# Patient Record
Sex: Male | Born: 1960 | Race: White | Hispanic: No | Marital: Married | Smoking: Former smoker
Health system: Southern US, Community
[De-identification: ages and names within clinical notes are randomized; demographics above are authoritative.]

## PROBLEM LIST (undated history)

## (undated) DIAGNOSIS — I10 Essential (primary) hypertension: Secondary | ICD-10-CM

## (undated) DIAGNOSIS — M1990 Unspecified osteoarthritis, unspecified site: Secondary | ICD-10-CM

## (undated) DIAGNOSIS — K219 Gastro-esophageal reflux disease without esophagitis: Secondary | ICD-10-CM

## (undated) HISTORY — DX: Essential (primary) hypertension: I10

## (undated) HISTORY — PX: OTHER SURGICAL HISTORY: SHX169

## (undated) HISTORY — PX: EXCISIONAL HEMORRHOIDECTOMY: SHX1541

## (undated) HISTORY — DX: Gastro-esophageal reflux disease without esophagitis: K21.9

## (undated) HISTORY — DX: Unspecified osteoarthritis, unspecified site: M19.90

---

## 1983-05-15 HISTORY — PX: OTHER SURGICAL HISTORY: SHX169

## 2016-12-06 ENCOUNTER — Ambulatory Visit (INDEPENDENT_AMBULATORY_CARE_PROVIDER_SITE_OTHER): Payer: Self-pay | Admitting: Orthopaedic Surgery

## 2016-12-06 ENCOUNTER — Encounter (INDEPENDENT_AMBULATORY_CARE_PROVIDER_SITE_OTHER): Payer: Self-pay | Admitting: Orthopaedic Surgery

## 2016-12-06 ENCOUNTER — Ambulatory Visit (INDEPENDENT_AMBULATORY_CARE_PROVIDER_SITE_OTHER): Payer: BC Managed Care – PPO | Admitting: Orthopaedic Surgery

## 2016-12-06 ENCOUNTER — Ambulatory Visit (INDEPENDENT_AMBULATORY_CARE_PROVIDER_SITE_OTHER): Payer: Self-pay

## 2016-12-06 VITALS — BP 147/97 | HR 67 | Ht 69.0 in | Wt 195.0 lb

## 2016-12-06 DIAGNOSIS — M25521 Pain in right elbow: Secondary | ICD-10-CM | POA: Diagnosis not present

## 2016-12-06 DIAGNOSIS — M7711 Lateral epicondylitis, right elbow: Secondary | ICD-10-CM

## 2016-12-06 MED ORDER — BUPIVACAINE HCL 0.5 % IJ SOLN
1.0000 mL | INTRAMUSCULAR | Status: AC | PRN
  Administered 2016-12-06: 1 mL via INTRA_ARTICULAR

## 2016-12-06 MED ORDER — METHYLPREDNISOLONE ACETATE 40 MG/ML IJ SUSP
40.0000 mg | INTRAMUSCULAR | Status: AC | PRN
  Administered 2016-12-06: 40 mg via INTRA_ARTICULAR

## 2016-12-06 MED ORDER — LIDOCAINE HCL 1 % IJ SOLN
1.0000 mL | INTRAMUSCULAR | Status: AC | PRN
  Administered 2016-12-06: 1 mL
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
## 2016-12-06 NOTE — Addendum Note (Signed)
Addended by: Annell GreeningYATES, Trenten Watchman C on: 12/06/2016 11:09 AM   Modules accepted: Level of Service
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
## 2016-12-27 ENCOUNTER — Encounter (INDEPENDENT_AMBULATORY_CARE_PROVIDER_SITE_OTHER): Payer: Self-pay | Admitting: Orthopaedic Surgery

## 2016-12-27 ENCOUNTER — Ambulatory Visit (INDEPENDENT_AMBULATORY_CARE_PROVIDER_SITE_OTHER): Payer: BC Managed Care – PPO | Admitting: Orthopaedic Surgery

## 2016-12-27 VITALS — BP 136/92 | HR 66 | Ht 69.0 in | Wt 195.0 lb

## 2016-12-27 DIAGNOSIS — M7711 Lateral epicondylitis, right elbow: Secondary | ICD-10-CM

## 2016-12-27 NOTE — Progress Notes (Signed)
Office Visit Note   Patient: Mathew Diaz           Date of Birth: [birth year]           MRN: [id number] Visit Date: 12/27/2016              Requested by: Wendall Papa [address] [place], [place] [address] PCP: Wendall Papa   Assessment & Plan: Visit Diagnoses:  1. Lateral epicondylitis, right elbow     Plan: Patient will return if he has recurrent symptoms. He can gradually work his way out of the splint for his wrist continue to use the tennis elbow splint for about a month. We discussed the pathophysiology of the condition and he'll return if he has recurrent symptoms.  Follow-Up Instructions: Return if symptoms worsen or fail to improve.   Orders:  No orders of the defined types were placed in this encounter.  No orders of the defined types were placed in this encounter.     Procedures: No procedures performed   Clinical Data: No additional findings.   Subjective: Chief Complaint  Patient presents with  . Right Elbow - Follow-up    HPI fixational now returns with the previous right lateral epicondylar injection 12/06/2016. He knows he can grip objects of lungs is not heavy and does not have the pain that he had prior to the injection. Sleeping better. He had some numbness in the ulnar 2 fingers states he still has some numbness but not as severe. Opposite arm is the one that had multiple tendon transfers from the old brachial plexus injury. Patient is happy with the result of the injection and is almost back to normal activity except for heavy use squeezing and gripping of his right hand which is trying to avoid.  Review of Systems Review of systems is unchanged from 12/06/2016 office visit.  Objective: Vital Signs: BP (!) 136/92   Pulse 66   Ht 5\' 9"  (1.753 m)   Wt 195 lb (88.5 kg)   BMI 28.80 kg/m   Physical Exam  Constitutional: He is oriented to person, place, and time. He appears well-developed and well-nourished.  HENT:  Head:  Normocephalic and atraumatic.  Eyes: Pupils are equal, round, and reactive to light. EOM are normal.  Neck: No tracheal deviation present. No thyromegaly present.  Cardiovascular: Normal rate.   Pulmonary/Chest: Effort normal. He has no wheezes.  Abdominal: Soft. Bowel sounds are normal.  Neurological: He is alert and oriented to person, place, and time.  Skin: Skin is warm and dry. Capillary refill takes less than 2 seconds.  Psychiatric: He has a normal mood and affect. His behavior is normal. Judgment and thought content normal.    Ortho Exam patient still using the tennis elbow splint and wrist splint. Mild tenderness to lateral condyle on the right wrist. He has the intact sensation ulnar 2 fingers. Mild brachial plexus tenderness on the right. No rash over exposed skin. Healed incisions on his opposite left arm for multiple tendon transfers years ago.  Specialty Comments:  No specialty comments available.  Imaging: No results found.   PMFS History: There are no active problems to display for this patient.  Past Medical History:  Diagnosis Date  . Acid reflux   . Arthritis   . Hypertension     Family History  Problem Relation Age of Onset  . Cancer Father     Past Surgical History:  Procedure Laterality Date  . EXCISIONAL HEMORRHOIDECTOMY  Social History   Occupational History  . Not on file.   Social History Main Topics  . Smoking status: Former Games developermoker  . Smokeless tobacco: Former NeurosurgeonUser  . Alcohol use No  . Drug use: No  . Sexual activity: Not on file

## 2017-05-11 ENCOUNTER — Emergency Department (HOSPITAL_COMMUNITY)
Admission: EM | Admit: 2017-05-11 | Discharge: 2017-05-11 | Disposition: A | Discharge status: Home / Self Care | Payer: BC Managed Care – PPO | Attending: Emergency Medicine | Admitting: Emergency Medicine

## 2017-05-11 ENCOUNTER — Emergency Department (HOSPITAL_COMMUNITY): Payer: BC Managed Care – PPO

## 2017-05-11 ENCOUNTER — Encounter (HOSPITAL_COMMUNITY): Payer: Self-pay | Admitting: Emergency Medicine

## 2017-05-11 DIAGNOSIS — R1013 Epigastric pain: Secondary | ICD-10-CM

## 2017-05-11 DIAGNOSIS — Z79899 Other long term (current) drug therapy: Secondary | ICD-10-CM | POA: Diagnosis not present

## 2017-05-11 DIAGNOSIS — I10 Essential (primary) hypertension: Secondary | ICD-10-CM | POA: Diagnosis not present

## 2017-05-11 DIAGNOSIS — R079 Chest pain, unspecified: Secondary | ICD-10-CM | POA: Insufficient documentation

## 2017-05-11 DIAGNOSIS — K30 Functional dyspepsia: Secondary | ICD-10-CM | POA: Insufficient documentation

## 2017-05-11 DIAGNOSIS — Z87891 Personal history of nicotine dependence: Secondary | ICD-10-CM | POA: Diagnosis not present

## 2017-05-11 LAB — BASIC METABOLIC PANEL
ANION GAP: 12 (ref 5–15)
BUN: 16 mg/dL (ref 6–20)
CALCIUM: 9.2 mg/dL (ref 8.9–10.3)
CO2: 24 mmol/L (ref 22–32)
Chloride: 101 mmol/L (ref 101–111)
Creatinine, Ser: 1.27 mg/dL — ABNORMAL HIGH (ref 0.61–1.24)
GFR calc Af Amer: >60 mL/min (ref >60)
GLUCOSE: 110 mg/dL — AB (ref 65–99)
Potassium: 3.7 mmol/L (ref 3.5–5.1)
Sodium: 137 mmol/L (ref 135–145)

## 2017-05-11 LAB — CBC
HCT: 48.4 % (ref 39.0–52.0)
HEMOGLOBIN: 16.3 g/dL (ref 13.0–17.0)
MCH: 31.1 pg (ref 26.0–34.0)
MCHC: 33.7 g/dL (ref 30.0–36.0)
MCV: 92.4 fL (ref 78.0–100.0)
PLATELETS: 356 10*3/uL (ref 150–400)
RBC: 5.24 MIL/uL (ref 4.22–5.81)
RDW: 13.3 % (ref 11.5–15.5)
WBC: 17.6 10*3/uL — ABNORMAL HIGH (ref 4.0–10.5)

## 2017-05-11 LAB — I-STAT TROPONIN, ED
TROPONIN I, POC: 0 ng/mL (ref 0.00–0.08)
Troponin i, poc: 0.01 ng/mL (ref 0.00–0.08)

## 2017-05-11 MED ORDER — GI COCKTAIL ~~LOC~~
30.0000 mL | Freq: Once | ORAL | Status: AC
  Administered 2017-05-11: 30 mL via ORAL
  Filled 2017-05-11: qty 30

## 2017-05-11 MED ORDER — HYDROCODONE-ACETAMINOPHEN 5-325 MG PO TABS: 1.0000 | ORAL_TABLET | ORAL | 0 refills | Status: DC | PRN

## 2017-05-11 MED ORDER — MORPHINE SULFATE (PF) 4 MG/ML IV SOLN
4.0000 mg | Freq: Once | INTRAVENOUS | Status: AC
  Administered 2017-05-11: 4 mg via INTRAVENOUS
  Filled 2017-05-11: qty 1

## 2017-05-11 MED ORDER — NITROGLYCERIN 0.4 MG SL SUBL
0.4000 mg | SUBLINGUAL_TABLET | Freq: Once | SUBLINGUAL | Status: AC
  Administered 2017-05-11: 0.4 mg via SUBLINGUAL
  Filled 2017-05-11: qty 1

## 2017-05-11 MED ORDER — NITROGLYCERIN 0.4 MG SL SUBL
SUBLINGUAL_TABLET | SUBLINGUAL | Status: AC
  Administered 2017-05-11: 0.4 mg
  Filled 2017-05-11: qty 1

## 2017-05-11 MED ORDER — ASPIRIN 325 MG PO TABS
325.0000 mg | ORAL_TABLET | Freq: Once | ORAL | Status: AC
  Administered 2017-05-11: 325 mg via ORAL
  Filled 2017-05-11: qty 1

## 2017-05-11 MED ORDER — CYCLOBENZAPRINE HCL 10 MG PO TABS: 10.0000 mg | ORAL_TABLET | Freq: Two times a day (BID) | ORAL | 0 refills | Status: DC | PRN

## 2017-05-11 MED ORDER — ONDANSETRON HCL 4 MG/2ML IJ SOLN
4.0000 mg | Freq: Once | INTRAMUSCULAR | Status: AC
  Administered 2017-05-11: 4 mg via INTRAVENOUS
  Filled 2017-05-11: qty 2

## 2017-05-11 NOTE — ED Notes (Signed)
Dr Cook at bedside
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
## 2017-05-11 NOTE — ED Triage Notes (Signed)
Pt reports he has been having chest pain since midnight last night.  Describes as pressure in center of chest with some pain down left arm.  Pt has brachial plexus injury to left arm with minimal feeling, but began having pain last night as well.

## 2017-05-11 NOTE — Discharge Instructions (Signed)
Tests show no life-threatening condition.  Both ibuprofen and Aleve can cause stomach upset.  Recommend Gas-X.  Prescription for pain medicine and muscle relaxer.  Follow-up your primary care doctor or return if worse.  Phone number for local cardiology given.

## 2017-05-23 ENCOUNTER — Ambulatory Visit (INDEPENDENT_AMBULATORY_CARE_PROVIDER_SITE_OTHER): Payer: BC Managed Care – PPO | Admitting: Orthopaedic Surgery

## 2017-05-23 ENCOUNTER — Ambulatory Visit (INDEPENDENT_AMBULATORY_CARE_PROVIDER_SITE_OTHER): Payer: Self-pay

## 2017-05-23 ENCOUNTER — Encounter (INDEPENDENT_AMBULATORY_CARE_PROVIDER_SITE_OTHER): Payer: Self-pay | Admitting: Orthopaedic Surgery

## 2017-05-23 VITALS — BP 126/92 | HR 81

## 2017-05-23 DIAGNOSIS — M542 Cervicalgia: Secondary | ICD-10-CM | POA: Diagnosis not present
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
## 2017-07-18 ENCOUNTER — Ambulatory Visit (INDEPENDENT_AMBULATORY_CARE_PROVIDER_SITE_OTHER): Payer: BC Managed Care – PPO | Admitting: Orthopaedic Surgery

## 2019-08-10 IMAGING — DX DG CHEST 2V
2 series · 2 of 2 positions shown · non-contrast
Comparison: None.

CLINICAL DATA: Chest pain

EXAM:
CHEST  2 VIEW

[chest lat (1 of 2)]
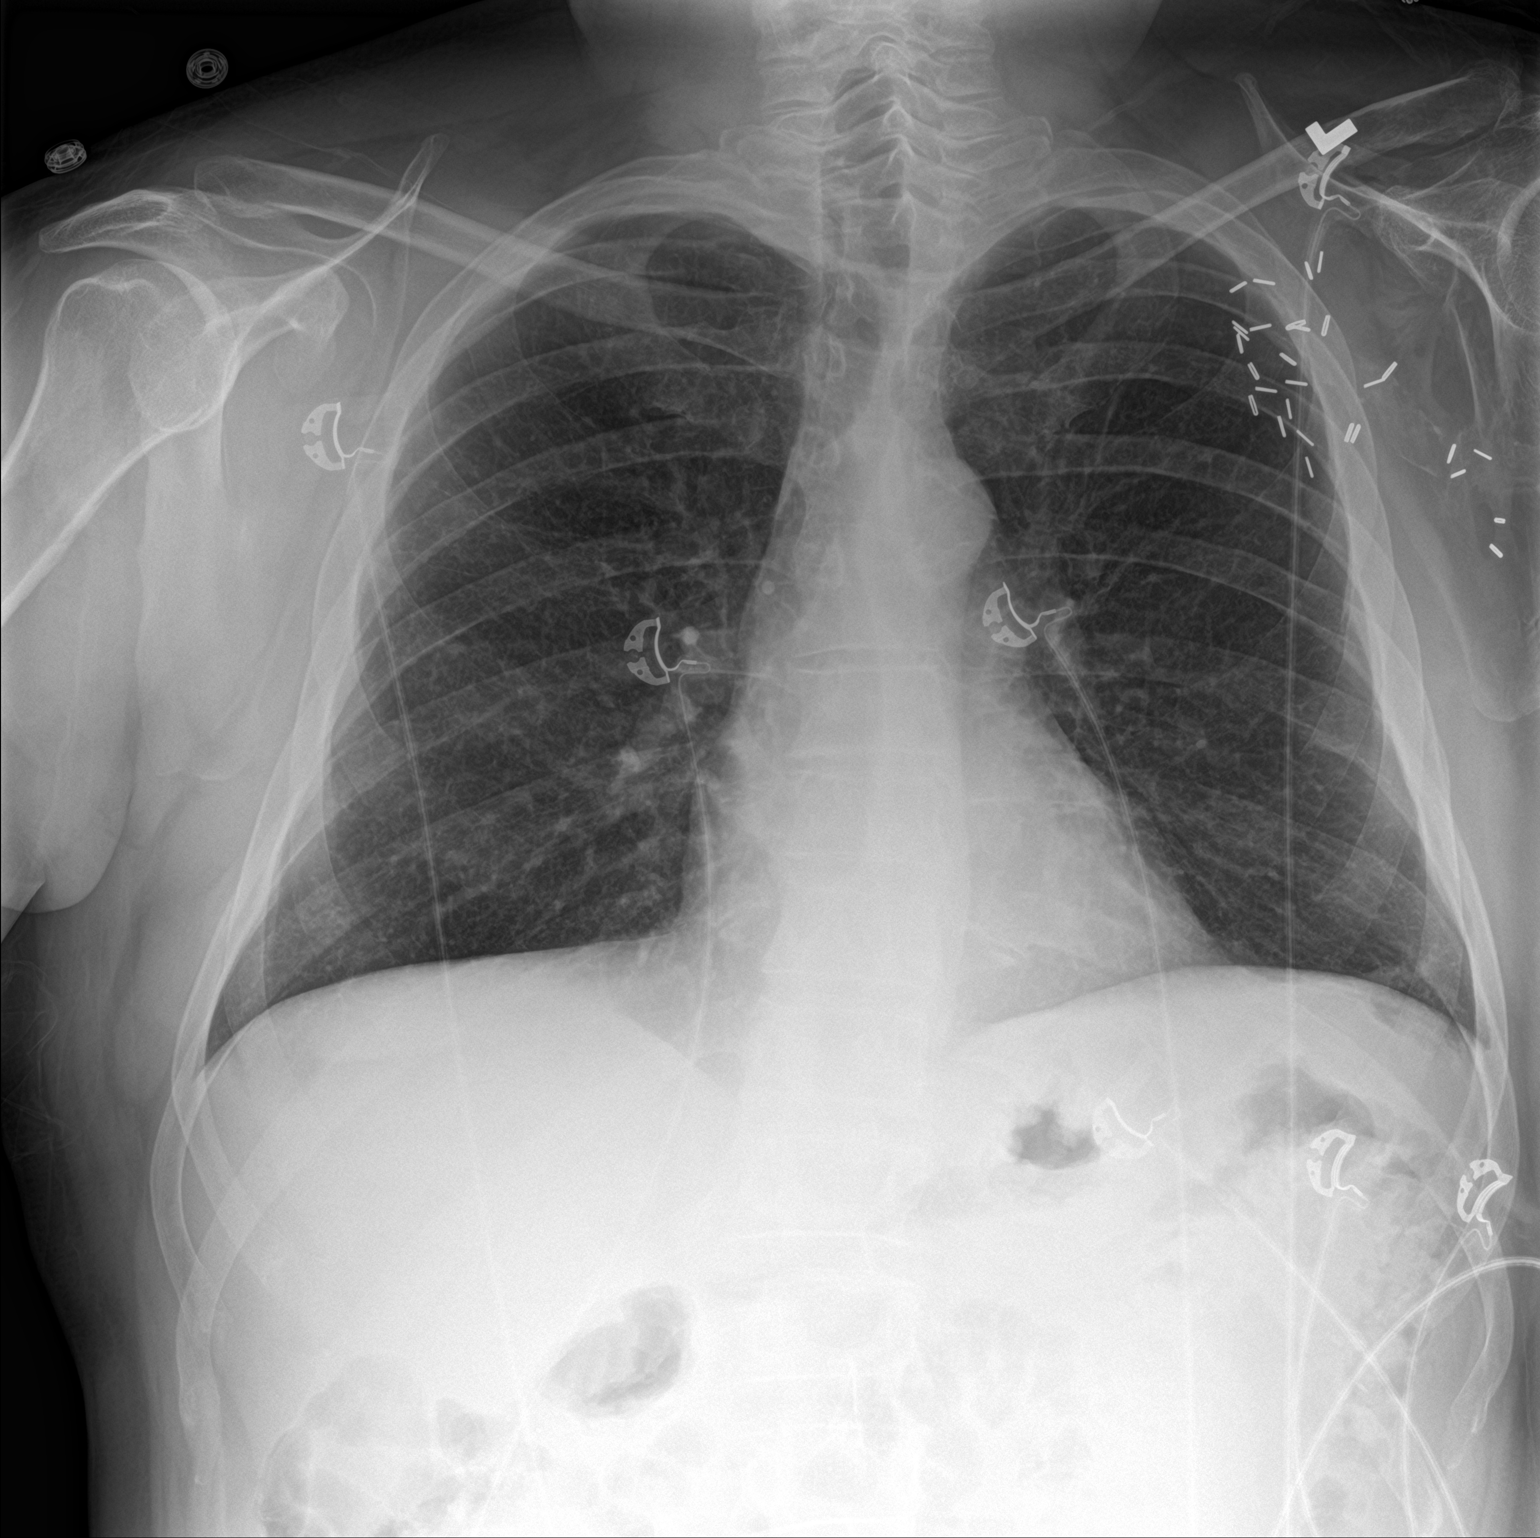

[chest lat (2 of 2)]
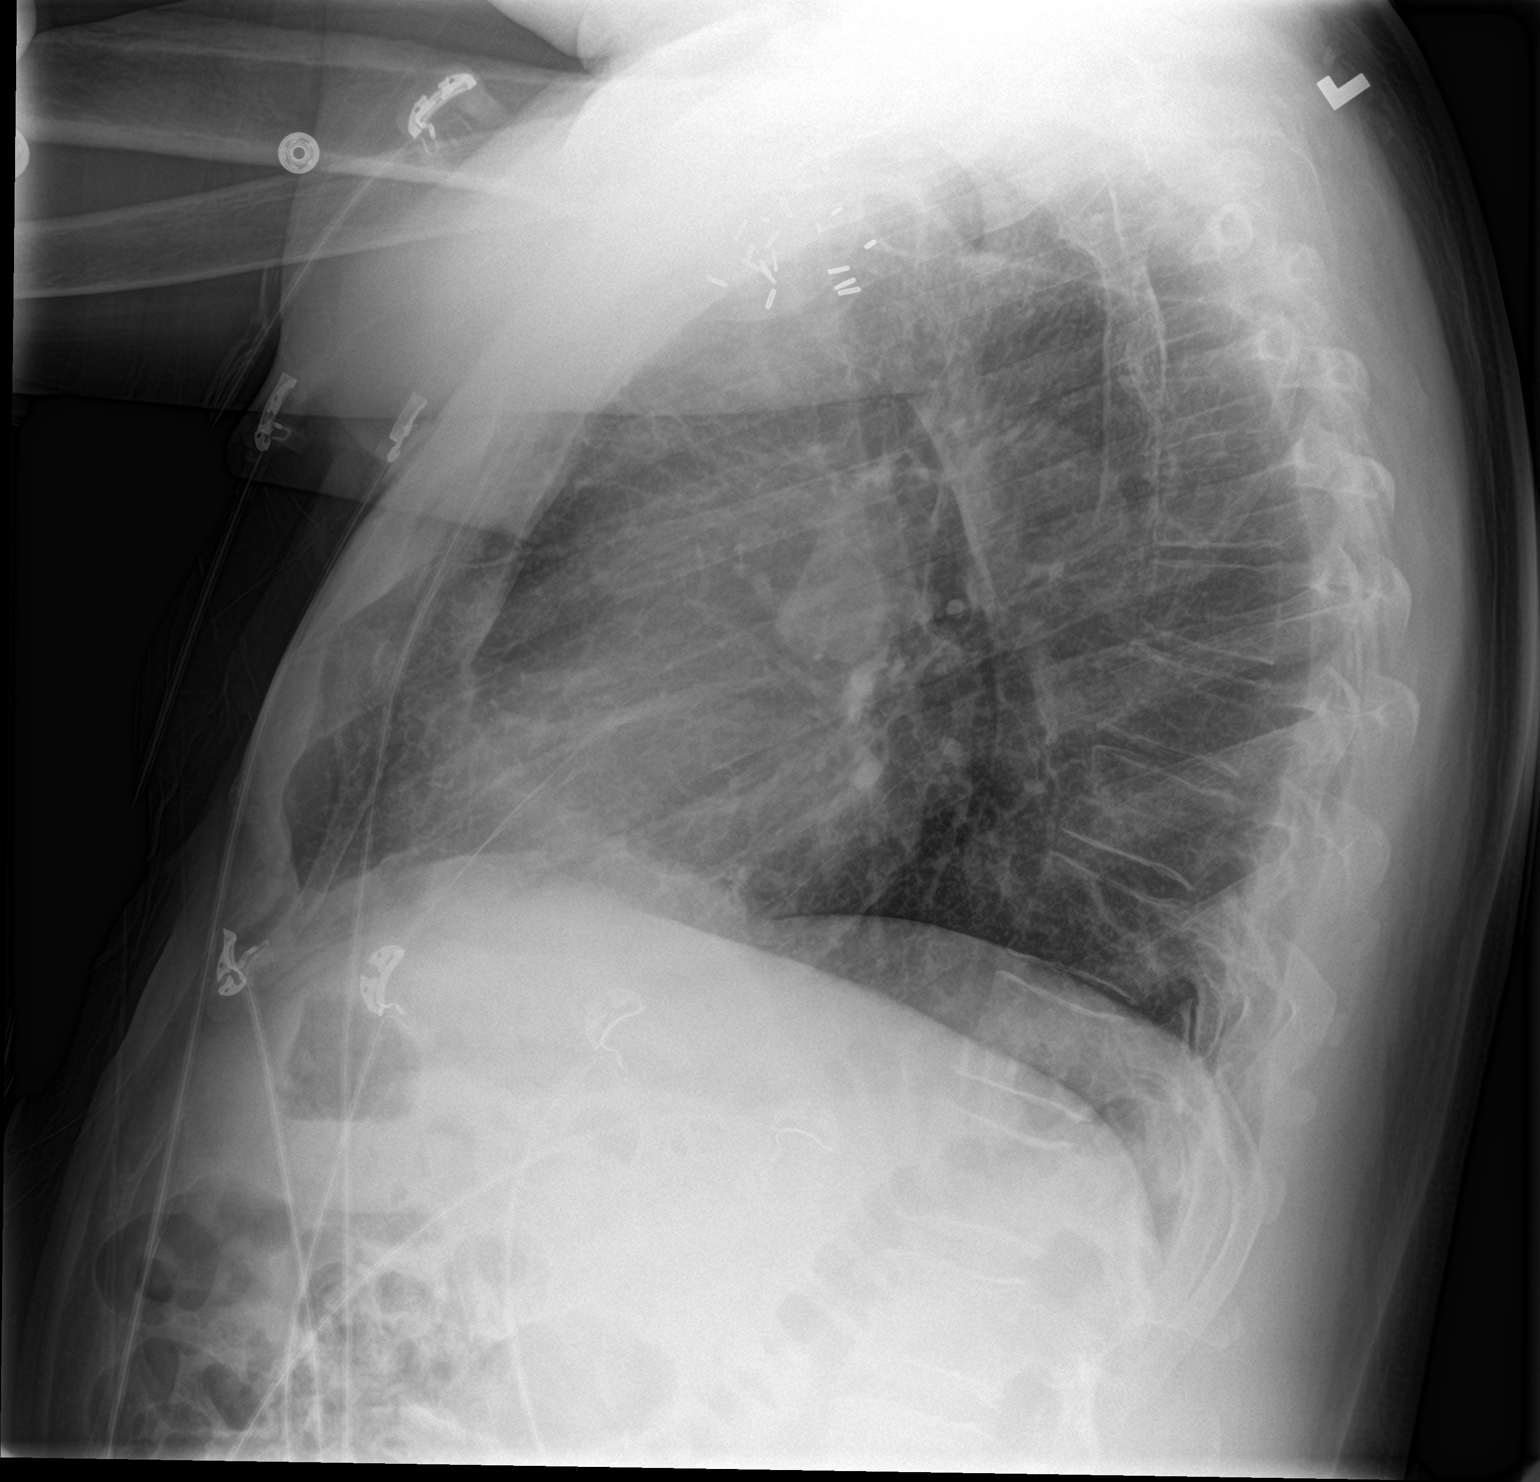

[2 of 2 positions shown; findings below may reference images not displayed]

FINDINGS: Lateral view degraded by patient arm position. Surgical clips about
the left axilla. Midline trachea. Normal heart size and mediastinal
contours. No pleural effusion or pneumothorax. Minimal scarring at
the left lung base.
IMPRESSION: No acute cardiopulmonary disease.

## 2021-12-27 ENCOUNTER — Ambulatory Visit: Payer: BC Managed Care – PPO | Admitting: Orthopedic Surgery

## 2022-01-17 ENCOUNTER — Ambulatory Visit (INDEPENDENT_AMBULATORY_CARE_PROVIDER_SITE_OTHER): Payer: BC Managed Care – PPO | Admitting: Orthopedic Surgery

## 2022-01-17 ENCOUNTER — Encounter: Payer: Self-pay | Admitting: Orthopedic Surgery

## 2022-01-17 ENCOUNTER — Ambulatory Visit (INDEPENDENT_AMBULATORY_CARE_PROVIDER_SITE_OTHER): Payer: Self-pay

## 2022-01-17 VITALS — BP 132/76 | HR 69 | Ht 69.0 in | Wt 226.0 lb

## 2022-01-17 DIAGNOSIS — M79602 Pain in left arm: Secondary | ICD-10-CM

## 2022-01-17 DIAGNOSIS — M79601 Pain in right arm: Secondary | ICD-10-CM

## 2022-01-17 MED ORDER — CYCLOBENZAPRINE HCL 10 MG PO TABS: 10.0000 mg | ORAL_TABLET | Freq: Two times a day (BID) | ORAL | 0 refills | Status: DC | PRN

## 2022-01-17 MED ORDER — PREDNISONE 10 MG (21) PO TBPK: ORAL_TABLET | ORAL | 0 refills | Status: DC

## 2022-01-17 NOTE — Progress Notes (Signed)
New Patient Visit  Assessment: Mathew Diaz is a 61 y.o. male with the following: 1. Right arm pain, numbness  Plan: Mathew Diaz has chronic pain, numbness and tingling in his right hand which has been ongoing for several years.  He reports an acute worsening approximately 3 weeks ago.  Radiographs obtained in clinic today demonstrate diffuse degenerative changes.  No evidence of anterolisthesis.  He has previously been evaluated by Dr. Ophelia Charter, and he states that his symptoms did not really change, until recently.  Since his recent injury, the numbness has been constant.  I provided him with a prescription for prednisone, as well as Flexeril.  He will schedule a follow-up appoint with Dr. Ophelia Charter for further evaluation.  Follow-up: Return if symptoms worsen or fail to improve.  Subjective:  Chief Complaint  Patient presents with   Arm Injury    L/ It feels weak.    History of Present Illness: Mathew Diaz is a 61 y.o. male who presents for evaluation of upper extremity weakness.  He has a history of neck pain, with numbness into the right hand.  He also has an extensive history of brachial plexus injury, required multiple procedures many years ago, completed at [facility].  He has done fairly well in this regard.  He saw Dr. Ophelia Charter several years ago, complaining of neck pain, and having some numbness in the right hand at that time.  The symptoms have not really improved.  Approximately 3 weeks ago, he was doing some work, when he noted a popping sensation in the medial elbow, which was associated with shooting numbness and tingling.  Since then, the whole hand has been numb.  This gets worse with activity.  It is affecting his sleep.   Review of Systems: No fevers or chills + numbness and tingling No chest pain No shortness of breath No bowel or bladder dysfunction No GI distress No headaches   Medical History:  Past Medical History:  Diagnosis Date   Acid reflux    Arthritis     Hypertension     Past Surgical History:  Procedure Laterality Date   EXCISIONAL HEMORRHOIDECTOMY      Family History  Problem Relation Age of Onset   Cancer Father    Social History   Tobacco Use   Smoking status: Former   Smokeless tobacco: Former  Substance Use Topics   Alcohol use: No   Drug use: No    Allergies  Allergen Reactions   Diovan [Valsartan]     dizziness   Lisinopril     ? Antihypertensive, ? Increased creatinine     Norvasc [Amlodipine Besylate]     Leg swelling    Current Meds  Medication Sig   cyclobenzaprine (FLEXERIL) 10 MG tablet Take 1 tablet (10 mg total) by mouth 2 (two) times daily as needed for muscle spasms.   predniSONE (STERAPRED UNI-PAK 21 TAB) 10 MG (21) TBPK tablet 10 mg DS 12 as directed    Objective: BP 132/76   Pulse 69   Ht 5\' 9"  (1.753 m)   Wt 226 lb (102.5 kg)   BMI 33.37 kg/m   Physical Exam:  General: Alert and oriented. and No acute distress. Gait: Normal gait.  Evaluation of the left arm demonstrates a sequelae of his prior surgeries.  No general atrophy compared to the right.  Well-healed surgical incisions.  Restricted range of motion and function of the left hand.  Right arm with normal strength and muscle bulk.  No bruising  or swelling about the elbow.  He does have some tenderness to the medial epicondyle.  Negative Tinel's at the cubital tunnel.  Negative Tinel's at the carpal tunnel.  Grip strength is excellent.  He has some discomfort with resisted wrist flexion.  Full range of motion of the right shoulder.  IMAGING: I personally ordered and reviewed the following images  X-ray of the cervical spine demonstrates appropriate alignment.  No acute injuries are noted.  Diffuse degenerative changes, with associated osteophytes, and loss of disc height at C4-5 and C5-6.  No anterolisthesis.  Impression: Cervical spine x-rays with degenerative changes   New Medications:  Meds ordered this encounter   Medications   predniSONE (STERAPRED UNI-PAK 21 TAB) 10 MG (21) TBPK tablet    Sig: 10 mg DS 12 as directed    Dispense:  48 tablet    Refill:  0   cyclobenzaprine (FLEXERIL) 10 MG tablet    Sig: Take 1 tablet (10 mg total) by mouth 2 (two) times daily as needed for muscle spasms.    Dispense:  20 tablet    Refill:  0      Oliver Barre, MD  01/17/2022 9:33 AM

## 2022-01-25 ENCOUNTER — Ambulatory Visit: Payer: BC Managed Care – PPO | Admitting: Orthopaedic Surgery

## 2022-01-25 ENCOUNTER — Encounter: Payer: Self-pay | Admitting: Orthopaedic Surgery

## 2022-01-25 VITALS — Ht 69.0 in | Wt 226.0 lb

## 2022-01-25 DIAGNOSIS — M47812 Spondylosis without myelopathy or radiculopathy, cervical region: Secondary | ICD-10-CM

## 2022-01-25 DIAGNOSIS — M542 Cervicalgia: Secondary | ICD-10-CM | POA: Diagnosis not present

## 2022-01-25 DIAGNOSIS — M4722 Other spondylosis with radiculopathy, cervical region: Secondary | ICD-10-CM

## 2022-01-25 NOTE — Progress Notes (Unsigned)
   Office Visit Note   Patient: Mathew Diaz           Date of Birth: 06-29-1960           MRN: [id number] Visit Date: 01/25/2022              Requested by: Wendall Papa [address] [place],  [place] [address] PCP: Wendall Papa   Assessment & Plan: Visit Diagnoses: No diagnosis found.  Plan: ***  Follow-Up Instructions: No follow-ups on file.   Orders:  No orders of the defined types were placed in this encounter.  No orders of the defined types were placed in this encounter.     Procedures: No procedures performed   Clinical Data: No additional findings.   Subjective: Chief Complaint  Patient presents with   Right Arm - Pain, Follow-up    HPI  Review of Systems   Objective: Vital Signs: Ht 5\' 9"  (1.753 m)   Wt 226 lb (102.5 kg)   BMI 33.37 kg/m   Physical Exam  Ortho Exam  Specialty Comments:  No specialty comments available.  Imaging: No results found.   PMFS History: There are no problems to display for this patient.  Past Medical History:  Diagnosis Date   Acid reflux    Arthritis    Hypertension     Family History  Problem Relation Age of Onset   Cancer Father     Past Surgical History:  Procedure Laterality Date   Brachaeplexis  1985   EXCISIONAL HEMORRHOIDECTOMY     Tendon Transfers     Social History   Occupational History   Not on file  Tobacco Use   Smoking status: Former   Smokeless tobacco: Former  Substance and Sexual Activity   Alcohol use: No   Drug use: No   Sexual activity: Not on file

## 2022-01-26 DIAGNOSIS — M4722 Other spondylosis with radiculopathy, cervical region: Secondary | ICD-10-CM | POA: Insufficient documentation

## 2022-02-23 ENCOUNTER — Telehealth: Payer: Self-pay | Admitting: Radiology

## 2022-02-23 NOTE — Telephone Encounter (Signed)
noted 

## 2022-02-23 NOTE — Telephone Encounter (Signed)
Please see message from [place] office below and advise.  MRI report under Care Everywhere tab (done at [facility] in [place]).   Pt has been waiting on a call about MRI results. Didn't realize he was supposed to make a follow up appointment. Can Dr. Lorin Mercy call hi the results? He can be reached at [phone]. He can leave a message if needs to.

## 2022-03-15 ENCOUNTER — Encounter: Payer: Self-pay | Admitting: Orthopaedic Surgery

## 2022-03-15 ENCOUNTER — Ambulatory Visit (INDEPENDENT_AMBULATORY_CARE_PROVIDER_SITE_OTHER): Payer: BC Managed Care – PPO | Admitting: Orthopaedic Surgery

## 2022-03-15 VITALS — Ht 69.0 in | Wt 226.0 lb

## 2022-03-15 DIAGNOSIS — M542 Cervicalgia: Secondary | ICD-10-CM | POA: Diagnosis not present

## 2022-03-15 DIAGNOSIS — M7541 Impingement syndrome of right shoulder: Secondary | ICD-10-CM | POA: Diagnosis not present

## 2022-03-15 DIAGNOSIS — M4722 Other spondylosis with radiculopathy, cervical region: Secondary | ICD-10-CM | POA: Diagnosis not present

## 2022-03-15 MED ORDER — METHYLPREDNISOLONE ACETATE 40 MG/ML IJ SUSP
40.0000 mg | INTRAMUSCULAR | Status: AC | PRN
  Administered 2022-03-15: 40 mg via INTRA_ARTICULAR

## 2022-03-15 MED ORDER — LIDOCAINE HCL 1 % IJ SOLN
0.5000 mL | INTRAMUSCULAR | Status: AC | PRN
  Administered 2022-03-15: .5 mL

## 2022-03-15 MED ORDER — BUPIVACAINE HCL 0.25 % IJ SOLN
4.0000 mL | INTRAMUSCULAR | Status: AC | PRN
  Administered 2022-03-15: 4 mL via INTRA_ARTICULAR

## 2022-03-15 NOTE — Progress Notes (Signed)
Office Visit Note   Patient: Mathew Diaz           Date of Birth: August 18, 1960           MRN: [id number] Visit Date: 03/15/2022              Requested by: Mathew Diaz [address] [place],  [place] [address] PCP: [place], [place]: Visit Diagnoses:  1. Impingement syndrome of right shoulder   2. Cervicalgia   3. Other spondylosis with radiculopathy, cervical region     Plan: Right subacromial injection performed.  We will have him see Dr. Ernestina Patches can either consider cervical epidural versus facet injection for his right-sided symptoms.  He also has symptoms that radiate into his ulnar 2 fingers and had old ligamentous injury to his right elbow may have some cubital tunnel syndrome as portion of his symptoms.  Shoulder seem to give me most of the trouble at subacromial injection performed.  We will see him back after he sees Dr. Ernestina Patches for cervical epidural or facet injection.  We discussed with him possibly cervical surgery versus EMG with nerve conduction velocity evaluation if he does not get improvement.  Follow-Up Instructions: No follow-ups on file.   Orders:  Orders Placed This Encounter  Procedures   Large Joint Inj: R subacromial bursa   Ambulatory referral to Physical Medicine Rehab   No orders of the defined types were placed in this encounter.     Procedures: Large Joint Inj: R subacromial bursa on 03/15/2022 10:33 AM Indications: pain Details: 22 G 1.5 in needle  Arthrogram: No  Medications: 4 mL bupivacaine 0.25 %; 40 mg methylPREDNISolone acetate 40 MG/ML; 0.5 mL lidocaine 1 % Outcome: tolerated well, no immediate complications Procedure, treatment alternatives, risks and benefits explained, specific risks discussed. Consent was given by the patient. Immediately prior to procedure a time out was called to verify the correct patient, procedure, equipment, support staff and site/side marked as required. Patient was prepped and draped in  the usual sterile fashion.       Clinical Data: No additional findings.   Subjective: Chief Complaint  Patient presents with   Neck - Pain, Follow-up    MRI cervical spine review    HPI 61 year old male working for the [employer] returns with ongoing problems with right-sided neck pain right shoulder pain numbness that radiates down to his hands.  He had brachial plexus injury with tendon transfers on the opposite left side with chronic weakness and numbness of the left arm.  He does not think the left arm is changed at all but has had progressive right shoulder symptoms pain with abduction of his shoulder and has degenerative facet changes with biforaminal C5 stenosis and right side C4 foraminal stenosis.  MRI scan is reviewed today.  Review of Systems updated unchanged from 01/25/2022 office visit.   Objective: Vital Signs: Ht 5\' 9"  (1.753 m)   Wt 226 lb (102.5 kg)   BMI 33.37 kg/m   Physical Exam Constitutional:      Appearance: He is well-developed.  HENT:     Head: Normocephalic and atraumatic.     Right Ear: External ear normal.     Left Ear: External ear normal.  Eyes:     Pupils: Pupils are equal, round, and reactive to light.  Neck:     Thyroid: No thyromegaly.     Trachea: No tracheal deviation.  Cardiovascular:     Rate and Rhythm: Normal  rate.  Pulmonary:     Effort: Pulmonary effort is normal.     Breath sounds: No wheezing.  Abdominal:     General: Bowel sounds are normal.     Palpations: Abdomen is soft.  Musculoskeletal:     Cervical back: Neck supple.  Skin:    General: Skin is warm and dry.     Capillary Refill: Capillary refill takes less than 2 seconds.  Neurological:     Mental Status: He is alert and oriented to person, place, and time.  Psychiatric:        Behavior: Behavior normal.        Thought Content: Thought content normal.        Judgment: Judgment normal.     Ortho Exam positive brachial plexus tenderness on the right  side positive Spurling.  Negative on the left.  Left arm radial nerve transfers for finger and thumb extension weak but active.  Positive impingement right shoulder.  Right upper extremity reflexes are symmetrical.  Specialty Comments:  No specialty comments available.  Imaging: mpression  1. Right-sided uncovertebral and facet hypertrophy at C3-4 with resultant moderate right C4 foraminal stenosis. 2. Degenerative disc bulge with uncovertebral spurring at C4-5 with resultant moderate bilateral C5 foraminal stenosis. 3. Relatively minor spondylosis elsewhere within the cervical spine as above. No other significant canal or foraminal stenosis.   Electronically Signed   By: Rise Mu M.D.   On: 02/05/2022 04:49   PMFS History: Patient Active Problem List   Diagnosis Date Noted   Other spondylosis with radiculopathy, cervical region 01/26/2022   Past Medical History:  Diagnosis Date   Acid reflux    Arthritis    Hypertension     Family History  Problem Relation Age of Onset   Cancer Father     Past Surgical History:  Procedure Laterality Date   Brachaeplexis  1985   EXCISIONAL HEMORRHOIDECTOMY     Tendon Transfers     Social History   Occupational History   Not on file  Tobacco Use   Smoking status: Former   Smokeless tobacco: Former  Substance and Sexual Activity   Alcohol use: No   Drug use: No   Sexual activity: Not on file

## 2022-03-22 ENCOUNTER — Telehealth: Payer: Self-pay | Admitting: Physical Medicine and Rehabilitation

## 2022-03-22 NOTE — Telephone Encounter (Signed)
Pt returned a call to [place] to set an appt with dr Alvester Morin. Please call pt at [phone].

## 2022-03-22 NOTE — Telephone Encounter (Signed)
Brittney spoke with patient and scheduled

## 2022-03-26 ENCOUNTER — Encounter: Payer: Self-pay | Admitting: Physical Medicine and Rehabilitation

## 2022-03-26 ENCOUNTER — Ambulatory Visit: Payer: BC Managed Care – PPO | Admitting: Physical Medicine and Rehabilitation

## 2022-03-26 DIAGNOSIS — M5412 Radiculopathy, cervical region: Secondary | ICD-10-CM | POA: Diagnosis not present

## 2022-03-26 DIAGNOSIS — G8929 Other chronic pain: Secondary | ICD-10-CM

## 2022-03-26 DIAGNOSIS — M4712 Other spondylosis with myelopathy, cervical region: Secondary | ICD-10-CM | POA: Diagnosis not present

## 2022-03-26 DIAGNOSIS — M25511 Pain in right shoulder: Secondary | ICD-10-CM | POA: Diagnosis not present

## 2022-03-26 MED ORDER — DIAZEPAM 5 MG PO TABS: ORAL_TABLET | ORAL | 0 refills | Status: DC

## 2022-03-26 NOTE — Progress Notes (Unsigned)
Mathew Diaz - 61 y.o. male MRN [id number]  Date of birth: 1961/01/16  Office Visit Note: Visit Date: 03/26/2022 PCP: Orvilla Cornwall C Referred by: Wendall Papa, NP  Subjective: Chief Complaint  Patient presents with   Neck - Pain   HPI: Mathew Diaz is a 61 y.o. male who comes in today per the request of Dr. Annell Greening for evaluation of chronic, worsening and severe right sided neck pain radiating to shoulder and down right arm to hand. Pain ongoing for several months and is exacerbated by movement and activity. He describes pain as sore, aching and numbness sensation to right arm/hand, currently rates as 7 out of 10. Some relief of pain with home exercise regimen, rest and use of medications. Recent cervical MRI imaging exhibits multi level facet hypertrophy, uncovertebral spurring at C4-C5 with resultant moderate bilateral C5 foraminal stenosis. No high grade spinal canal stenosis noted. No history of cervical surgery/injections. 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. Chronic weakness and decreased range of motion to left upper extremity. Patient recently underwent right subacromial bursa injection with Dr. Ophelia Charter, reports increased range of motion to right upper extremity post injection. Patient did discuss surgical options and possible cervical injection with Dr. Ophelia Charter, he would like to try injection before proceeding with surgery. Patient is currently working as Nurse, adult, is retiring in the next several days. Patient denies recent trauma or falls.    Review of Systems  Musculoskeletal:  Positive for neck pain.  Neurological:  Positive for tingling. Negative for focal weakness and weakness.  All other systems reviewed and are negative.  Otherwise per HPI.  Assessment & Plan: Visit Diagnoses:    ICD-10-CM   1. Radiculopathy, cervical region  M54.12 Ambulatory referral to  Physical Medicine Rehab    2. Other spondylosis with myelopathy, cervical region  M47.12 Ambulatory referral to Physical Medicine Rehab    3. Chronic right shoulder pain  M25.511 Ambulatory referral to Physical Medicine Rehab   [phone]        Plan: Findings:  Chronic, worsening and severe right sided neck pain radiating to shoulder and down right arm to hand. Patient continues to have severe pain despite good conservative therapies such as home exercise regimen, rest and use of medications. Patients clinical presentation and exam are consistent with C4/C5 nerve pattern. Next step is to perform diagnostic and hopefully therapeutic right C7-T1 interlaminar epidural steroid injection under fluoroscopic guidance. He is not currently taking anticoagulant medications. If patient gets good relief of pain with injection we can repeat this procedure infrequently as needed. Patient did voice concerns about anxiety surrounding injection procedure, I did prescribe pre-procedure Valium for him to take on day of injection. I did explain cervical epidural steroid injection procedure to patient today, he has no further questions at this time. If pain persists post injection Dr. Ophelia Charter did discuss possible surgical intervention vs NCV with EMG of right upper extremity in his last office note. No red flag symptoms noted upon exam today.     Meds & Orders:  Meds ordered this encounter  Medications   diazepam (VALIUM) 5 MG tablet    Sig: Take one tablet by mouth with food one hour prior to procedure. May repeat 30 minutes prior if needed.    Dispense:  2 tablet    Refill:  0    Orders Placed This Encounter  Procedures   Ambulatory referral  to Physical Medicine Rehab    Follow-up: Return for Right C7-T1 interlaminar epidural steroid injection.   Procedures: No procedures performed      Clinical History: EXAM:  MRI CERVICAL SPINE WITHOUT CONTRAST   TECHNIQUE:  Multiplanar, multisequence MR imaging of the  cervical spine was  performed. No intravenous contrast was administered.   COMPARISON:  Prior radiograph from 03/28/2021.   FINDINGS:  Alignment: Straightening of the normal cervical lordosis. No  listhesis.   Vertebrae: Vertebral body height maintained without acute or chronic  fracture. Bone marrow signal intensity mildly heterogeneous but  overall within normal limits. Small benign hemangioma noted within  the C7 vertebral body. No worrisome osseous lesions. No abnormal  marrow edema.   Cord: Normal signal and morphology.   Posterior Fossa, vertebral arteries, paraspinal tissues:  Unremarkable.   Disc levels:   C2-C3: Unremarkable.   C3-C4: Mild annular disc bulge with right-sided uncovertebral  spurring. Right greater than left facet hypertrophy. No spinal  stenosis. Moderate right C4 foraminal narrowing. Left neural foramen  remains patent.   C4-C5: Mild intervertebral disc space narrowing with diffuse disc  osteophyte complex, eccentric to the right. Flattening and partial  effacement of the ventral thecal sac without significant spinal  stenosis. Superimposed mild facet hypertrophy. Moderate bilateral C5  foraminal stenosis.   C5-C6: Minimal annular disc bulge. Mild left-sided facet  hypertrophy. No spinal stenosis. Foramina remain patent.   C6-C7: Minimal annular disc bulge. No spinal stenosis. Foramina  remain patent.   C7-T1: Negative interspace. Moderate right facet hypertrophy. No  spinal stenosis. Foramina remain patent.   IMPRESSION:  1. Right-sided uncovertebral and facet hypertrophy at C3-4 with  resultant moderate right C4 foraminal stenosis.  2. Degenerative disc bulge with uncovertebral spurring at C4-5 with  resultant moderate bilateral C5 foraminal stenosis.  3. Relatively minor spondylosis elsewhere within the cervical spine  as above. No other significant canal or foraminal stenosis.    Electronically Signed    By: Rise Mu M.D.     On: 02/05/2022 04:49   He reports that he has quit smoking. He has quit using smokeless tobacco. No results for input(s): "HGBA1C", "LABURIC" in the last 8760 hours.  Objective:  VS:  HT:    WT:   BMI:     BP:   HR: bpm  TEMP: ( )  RESP:  Physical Exam Vitals and nursing note reviewed.  HENT:     Head: Normocephalic and atraumatic.     Right Ear: External ear normal.     Left Ear: External ear normal.     Nose: Nose normal.     Mouth/Throat:     Mouth: Mucous membranes are moist.  Eyes:     Extraocular Movements: Extraocular movements intact.  Cardiovascular:     Rate and Rhythm: Normal rate.     Pulses: Normal pulses.  Pulmonary:     Effort: Pulmonary effort is normal.  Abdominal:     General: Abdomen is flat. There is no distension.  Musculoskeletal:        General: Tenderness present.     Cervical back: Tenderness present.     Comments: No discomfort noted with flexion, extension and side-to-side rotation. Patient has good strength to right upper extremity including 5 out of 5 strength in wrist extension, long finger flexion and APB. Restricted range of motion to left upper extremity. There is no atrophy of the hands intrinsically. Sensation intact bilaterally. Negative Hoffman's sign.   Skin:    General:  Skin is warm and dry.     Capillary Refill: Capillary refill takes less than 2 seconds.  Neurological:     Mental Status: He is alert and oriented to person, place, and time.     Motor: Weakness present.  Psychiatric:        Mood and Affect: Mood normal.        Behavior: Behavior normal.     Ortho Exam  Imaging: No results found.  Past Medical/Family/Surgical/Social History: Medications & Allergies reviewed per EMR, new medications updated. Patient Active Problem List   Diagnosis Date Noted   Other spondylosis with radiculopathy, cervical region 01/26/2022   Past Medical History:  Diagnosis Date   Acid reflux    Arthritis    Hypertension    Family  History  Problem Relation Age of Onset   Cancer Father    Past Surgical History:  Procedure Laterality Date   Brachaeplexis  1985   EXCISIONAL HEMORRHOIDECTOMY     Tendon Transfers     Social History   Occupational History   Not on file  Tobacco Use   Smoking status: Former   Smokeless tobacco: Former  Substance and Sexual Activity   Alcohol use: No   Drug use: No   Sexual activity: Not on file

## 2022-03-26 NOTE — Progress Notes (Unsigned)
Numeric Pain Rating Scale and Functional Assessment Average Pain 4   In the last MONTH (on 0-10 scale) has pain interfered with the following?  1. General activity like being  able to carry out your everyday physical activities such as walking, climbing stairs, carrying groceries, or moving a chair?  Rating( varies )    Neck pain that radiates into right shoulder. Pain can make last 2 digits numb. Sleeping is hard, moving right arm up and in circular motion can make pain worse

## 2022-04-03 ENCOUNTER — Ambulatory Visit: Payer: Self-pay

## 2022-04-03 ENCOUNTER — Ambulatory Visit: Payer: BC Managed Care – PPO | Admitting: Physical Medicine and Rehabilitation

## 2022-04-03 VITALS — BP 133/86 | HR 72

## 2022-04-03 DIAGNOSIS — M5412 Radiculopathy, cervical region: Secondary | ICD-10-CM | POA: Diagnosis not present

## 2022-04-03 MED ORDER — METHYLPREDNISOLONE ACETATE 80 MG/ML IJ SUSP
40.0000 mg | Freq: Once | INTRAMUSCULAR | Status: AC
  Administered 2022-04-03: 40 mg

## 2022-04-03 NOTE — Patient Instructions (Signed)

## 2022-04-03 NOTE — Progress Notes (Signed)
Numeric Pain Rating Scale and Functional Assessment Average Pain 3   In the last MONTH (on 0-10 scale) has pain interfered with the following?  1. General activity like being  able to carry out your everyday physical activities such as walking, climbing stairs, carrying groceries, or moving a chair?  Rating(8) Neck pain radiates down right arm causes 4th and 5th fingers to go numb.  +Driver, -BT, -Dye Allergies.

## 2022-04-10 NOTE — Progress Notes (Signed)
Mathew Diaz - 61 y.o. male MRN [id number]  Date of birth: Jun 07, 1960  Office Visit Note: Visit Date: 04/03/2022 PCP: Orvilla Cornwall C Referred by: Wendall Papa, NP  Subjective: Chief Complaint  Patient presents with   Neck - Pain   HPI:  Mathew Diaz is a 61 y.o. male who comes in today at the request of Dr. Annell Greening and Ellin Goodie, FNP for planned Right C7-T1 Cervical Interlaminar epidural steroid injection with fluoroscopic guidance.  The patient has failed conservative care including home exercise, medications, time and activity modification.  This injection will be diagnostic and hopefully therapeutic.  Please see requesting physician notes for further details and justification.   ROS Otherwise per HPI.  Assessment & Plan: Visit Diagnoses:    ICD-10-CM   1. Cervical radiculopathy  M54.12 XR C-ARM NO REPORT    Epidural Steroid injection    methylPREDNISolone acetate (DEPO-MEDROL) injection 40 mg      Plan: No additional findings.   Meds & Orders:  Meds ordered this encounter  Medications   methylPREDNISolone acetate (DEPO-MEDROL) injection 40 mg    Orders Placed This Encounter  Procedures   XR C-ARM NO REPORT   Epidural Steroid injection    Follow-up: Return for visit to requesting provider as needed.   Procedures: No procedures performed  Cervical Epidural Steroid Injection - Interlaminar Approach with Fluoroscopic Guidance  Patient: Mathew Diaz      Date of Birth: 1960-11-18 MRN: [id number] PCP: Wendall Papa      Visit Date: 04/03/2022   Universal Protocol:    Date/Time: 11/28/235:51 PM  Consent Given By: the patient  Position: PRONE  Additional Comments: Vital signs were monitored before and after the procedure. Patient was prepped and draped in the usual sterile fashion. The correct patient, procedure, and site was verified.   Injection Procedure Details:   Procedure diagnoses: Cervical radiculopathy [M54.12]     Meds Administered:  Meds ordered this encounter  Medications   methylPREDNISolone acetate (DEPO-MEDROL) injection 40 mg     Laterality: Right  Location/Site: C7-T1  Needle: 3.5 in., 20 ga. Tuohy  Needle Placement: Paramedian epidural space  Findings:  -Comments: Excellent flow of contrast into the epidural space.  Procedure Details: Using a paramedian approach from the side mentioned above, the region overlying the inferior lamina was localized under fluoroscopic visualization and the soft tissues overlying this structure were infiltrated with 4 ml. of 1% Lidocaine without Epinephrine. A # 20 gauge, Tuohy needle was inserted into the epidural space using a paramedian approach.  The epidural space was localized using loss of resistance along with contralateral oblique bi-planar fluoroscopic views.  After negative aspirate for air, blood, and CSF, a 2 ml. volume of Isovue-250 was injected into the epidural space and the flow of contrast was observed. Radiographs were obtained for documentation purposes.   The injectate was administered into the level noted above.  Additional Comments:  The patient tolerated the procedure well Dressing: 2 x 2 sterile gauze and Band-Aid    Post-procedure details: Patient was observed during the procedure. Post-procedure instructions were reviewed.  Patient left the clinic in stable condition.    Clinical History: EXAM:  MRI CERVICAL SPINE WITHOUT CONTRAST   TECHNIQUE:  Multiplanar, multisequence MR imaging of the cervical spine was  performed. No intravenous contrast was administered.   COMPARISON:  Prior radiograph from 03/28/2021.   FINDINGS:  Alignment: Straightening of the normal cervical lordosis. No  listhesis.   Vertebrae: Vertebral body height maintained  without acute or chronic  fracture. Bone marrow signal intensity mildly heterogeneous but  overall within normal limits. Small benign hemangioma noted within  the C7  vertebral body. No worrisome osseous lesions. No abnormal  marrow edema.   Cord: Normal signal and morphology.   Posterior Fossa, vertebral arteries, paraspinal tissues:  Unremarkable.   Disc levels:   C2-C3: Unremarkable.   C3-C4: Mild annular disc bulge with right-sided uncovertebral  spurring. Right greater than left facet hypertrophy. No spinal  stenosis. Moderate right C4 foraminal narrowing. Left neural foramen  remains patent.   C4-C5: Mild intervertebral disc space narrowing with diffuse disc  osteophyte complex, eccentric to the right. Flattening and partial  effacement of the ventral thecal sac without significant spinal  stenosis. Superimposed mild facet hypertrophy. Moderate bilateral C5  foraminal stenosis.   C5-C6: Minimal annular disc bulge. Mild left-sided facet  hypertrophy. No spinal stenosis. Foramina remain patent.   C6-C7: Minimal annular disc bulge. No spinal stenosis. Foramina  remain patent.   C7-T1: Negative interspace. Moderate right facet hypertrophy. No  spinal stenosis. Foramina remain patent.   IMPRESSION:  1. Right-sided uncovertebral and facet hypertrophy at C3-4 with  resultant moderate right C4 foraminal stenosis.  2. Degenerative disc bulge with uncovertebral spurring at C4-5 with  resultant moderate bilateral C5 foraminal stenosis.  3. Relatively minor spondylosis elsewhere within the cervical spine  as above. No other significant canal or foraminal stenosis.    Electronically Signed    By: Rise Mu M.D.    On: 02/05/2022 04:49     Objective:  VS:  HT:    WT:   BMI:     BP:133/86  HR:72bpm  TEMP: ( )  RESP:  Physical Exam Vitals and nursing note reviewed.  Constitutional:      General: He is not in acute distress.    Appearance: Normal appearance. He is not ill-appearing.  HENT:     Head: Normocephalic and atraumatic.     Right Ear: External ear normal.     Left Ear: External ear normal.  Eyes:      Extraocular Movements: Extraocular movements intact.  Cardiovascular:     Rate and Rhythm: Normal rate.     Pulses: Normal pulses.  Abdominal:     General: There is no distension.     Palpations: Abdomen is soft.  Musculoskeletal:        General: No signs of injury.     Cervical back: Neck supple. Tenderness present. No rigidity.     Right lower leg: No edema.     Left lower leg: No edema.     Comments: Patient has good strength in the upper extremities with 5 out of 5 strength in wrist extension long finger flexion APB.  No intrinsic hand muscle atrophy.  Negative Hoffmann's test.  Lymphadenopathy:     Cervical: No cervical adenopathy.  Skin:    Findings: No erythema or rash.  Neurological:     General: No focal deficit present.     Mental Status: He is alert and oriented to person, place, and time.     Sensory: No sensory deficit.     Motor: No weakness or abnormal muscle tone.     Coordination: Coordination normal.  Psychiatric:        Mood and Affect: Mood normal.        Behavior: Behavior normal.      Imaging: No results found.

## 2022-04-10 NOTE — Procedures (Signed)
Cervical Epidural Steroid Injection - Interlaminar Approach with Fluoroscopic Guidance  Patient: Mathew Diaz      Date of Birth: December 23, 1960 MRN: [id number] PCP: Wendall Papa      Visit Date: 04/03/2022   Universal Protocol:    Date/Time: 11/28/235:51 PM  Consent Given By: the patient  Position: PRONE  Additional Comments: Vital signs were monitored before and after the procedure. Patient was prepped and draped in the usual sterile fashion. The correct patient, procedure, and site was verified.   Injection Procedure Details:   Procedure diagnoses: Cervical radiculopathy [M54.12]    Meds Administered:  Meds ordered this encounter  Medications   methylPREDNISolone acetate (DEPO-MEDROL) injection 40 mg     Laterality: Right  Location/Site: C7-T1  Needle: 3.5 in., 20 ga. Tuohy  Needle Placement: Paramedian epidural space  Findings:  -Comments: Excellent flow of contrast into the epidural space.  Procedure Details: Using a paramedian approach from the side mentioned above, the region overlying the inferior lamina was localized under fluoroscopic visualization and the soft tissues overlying this structure were infiltrated with 4 ml. of 1% Lidocaine without Epinephrine. A # 20 gauge, Tuohy needle was inserted into the epidural space using a paramedian approach.  The epidural space was localized using loss of resistance along with contralateral oblique bi-planar fluoroscopic views.  After negative aspirate for air, blood, and CSF, a 2 ml. volume of Isovue-250 was injected into the epidural space and the flow of contrast was observed. Radiographs were obtained for documentation purposes.   The injectate was administered into the level noted above.  Additional Comments:  The patient tolerated the procedure well Dressing: 2 x 2 sterile gauze and Band-Aid    Post-procedure details: Patient was observed during the procedure. Post-procedure instructions were  reviewed.  Patient left the clinic in stable condition.

## 2023-01-11 ENCOUNTER — Telehealth: Payer: Self-pay

## 2023-01-11 NOTE — Telephone Encounter (Signed)
Patient called in for repeat neck injection. LVM to return call to get more information

## 2023-01-16 NOTE — Telephone Encounter (Signed)
Patient left voicemail for a repeat injection. LVM to return call to get more information

## 2023-01-17 ENCOUNTER — Other Ambulatory Visit: Payer: Self-pay | Admitting: Physical Medicine and Rehabilitation

## 2023-01-17 DIAGNOSIS — M5412 Radiculopathy, cervical region: Secondary | ICD-10-CM

## 2023-01-17 NOTE — Telephone Encounter (Signed)
Spoke with patient and he is requesting another injection. Last injection was in 03/2022 and lasted about 3 months and received more than 75% relief. No new falls, accidents, or injuries. Please advise

## 2023-01-25 ENCOUNTER — Telehealth: Payer: Self-pay

## 2023-01-25 NOTE — Telephone Encounter (Signed)
Patient is scheduled for injection on 9/23. Needs pre procedure Valium sent to [facility] in [place], [place]

## 2023-01-28 ENCOUNTER — Other Ambulatory Visit: Payer: Self-pay | Admitting: Physical Medicine and Rehabilitation

## 2023-01-28 MED ORDER — DIAZEPAM 5 MG PO TABS: ORAL_TABLET | ORAL | 0 refills | Status: DC

## 2023-02-04 ENCOUNTER — Other Ambulatory Visit: Payer: Self-pay

## 2023-02-04 ENCOUNTER — Ambulatory Visit: Payer: BC Managed Care – PPO | Admitting: Physical Medicine and Rehabilitation

## 2023-02-04 VITALS — BP 137/87 | HR 59

## 2023-02-04 DIAGNOSIS — M5412 Radiculopathy, cervical region: Secondary | ICD-10-CM

## 2023-02-04 MED ORDER — METHYLPREDNISOLONE ACETATE 80 MG/ML IJ SUSP
80.0000 mg | Freq: Once | INTRAMUSCULAR | Status: AC
  Administered 2023-02-04: 80 mg

## 2023-02-04 NOTE — Progress Notes (Unsigned)
Functional Pain Scale - descriptive words and definitions  Moderate (4)   Constantly aware of pain, can complete ADLs with modification/sleep marginally affected at times/passive distraction is of no use, but active distraction gives some relief. Moderate range order  Average Pain  varies   +Driver, -BT, -Dye Allergies.  Neck pain on the right side that radiates down the right arm and causes numbness

## 2023-02-04 NOTE — Patient Instructions (Signed)

## 2023-02-05 NOTE — Progress Notes (Signed)
Mathew Diaz - 62 y.o. male MRN [id number]  Date of birth: 1960/10/04  Office Visit Note: Visit Date: 02/04/2023 PCP: Orvilla Cornwall C Referred by: Wendall Papa, NP  Subjective: Chief Complaint  Patient presents with   Neck - Pain   HPI:  Mathew Diaz is a 62 y.o. male who comes in today for planned repeat Right C7-T1  Cervical Interlaminar epidural steroid injection with fluoroscopic guidance.  The patient has failed conservative care including home exercise, medications, time and activity modification.  This injection will be diagnostic and hopefully therapeutic.  Please see requesting physician notes for further details and justification. Patient received more than 50% pain relief from prior injection.   Referring: Dr. Annell Greening   ROS Otherwise per HPI.  Assessment & Plan: Visit Diagnoses:    ICD-10-CM   1. Cervical radiculopathy  M54.12 XR C-ARM NO REPORT    Epidural Steroid injection    methylPREDNISolone acetate (DEPO-MEDROL) injection 80 mg      Plan: No additional findings.   Meds & Orders:  Meds ordered this encounter  Medications   methylPREDNISolone acetate (DEPO-MEDROL) injection 80 mg    Orders Placed This Encounter  Procedures   XR C-ARM NO REPORT   Epidural Steroid injection    Follow-up: Return for visit to requesting provider as needed.   Procedures: No procedures performed  Cervical Epidural Steroid Injection - Interlaminar Approach with Fluoroscopic Guidance  Patient: Mathew Diaz      Date of Birth: 1961/04/07 MRN: [id number] PCP: Wendall Papa      Visit Date: 02/04/2023   Universal Protocol:    Date/Time: 09/24/248:49 AM  Consent Given By: the patient  Position: PRONE  Additional Comments: Vital signs were monitored before and after the procedure. Patient was prepped and draped in the usual sterile fashion. The correct patient, procedure, and site was verified.   Injection Procedure Details:   Procedure  diagnoses: Cervical radiculopathy [M54.12]    Meds Administered:  Meds ordered this encounter  Medications   methylPREDNISolone acetate (DEPO-MEDROL) injection 80 mg     Laterality: Right  Location/Site: C7-T1  Needle: 3.5 in., 20 ga. Tuohy  Needle Placement: Paramedian epidural space  Findings:  -Comments: Excellent flow of contrast into the epidural space.  Procedure Details: Using a paramedian approach from the side mentioned above, the region overlying the inferior lamina was localized under fluoroscopic visualization and the soft tissues overlying this structure were infiltrated with 4 ml. of 1% Lidocaine without Epinephrine. A # 20 gauge, Tuohy needle was inserted into the epidural space using a paramedian approach.  The epidural space was localized using loss of resistance along with contralateral oblique bi-planar fluoroscopic views.  After negative aspirate for air, blood, and CSF, a 2 ml. volume of Isovue-250 was injected into the epidural space and the flow of contrast was observed. Radiographs were obtained for documentation purposes.   The injectate was administered into the level noted above.  Additional Comments:  No complications occurred Dressing: 2 x 2 sterile gauze and Band-Aid    Post-procedure details: Patient was observed during the procedure. Post-procedure instructions were reviewed.  Patient left the clinic in stable condition.   Clinical History: EXAM:  MRI CERVICAL SPINE WITHOUT CONTRAST   TECHNIQUE:  Multiplanar, multisequence MR imaging of the cervical spine was  performed. No intravenous contrast was administered.   COMPARISON:  Prior radiograph from 03/28/2021.   FINDINGS:  Alignment: Straightening of the normal cervical lordosis. No  listhesis.   Vertebrae: Vertebral  body height maintained without acute or chronic  fracture. Bone marrow signal intensity mildly heterogeneous but  overall within normal limits. Small benign hemangioma  noted within  the C7 vertebral body. No worrisome osseous lesions. No abnormal  marrow edema.   Cord: Normal signal and morphology.   Posterior Fossa, vertebral arteries, paraspinal tissues:  Unremarkable.   Disc levels:   C2-C3: Unremarkable.   C3-C4: Mild annular disc bulge with right-sided uncovertebral  spurring. Right greater than left facet hypertrophy. No spinal  stenosis. Moderate right C4 foraminal narrowing. Left neural foramen  remains patent.   C4-C5: Mild intervertebral disc space narrowing with diffuse disc  osteophyte complex, eccentric to the right. Flattening and partial  effacement of the ventral thecal sac without significant spinal  stenosis. Superimposed mild facet hypertrophy. Moderate bilateral C5  foraminal stenosis.   C5-C6: Minimal annular disc bulge. Mild left-sided facet  hypertrophy. No spinal stenosis. Foramina remain patent.   C6-C7: Minimal annular disc bulge. No spinal stenosis. Foramina  remain patent.   C7-T1: Negative interspace. Moderate right facet hypertrophy. No  spinal stenosis. Foramina remain patent.   IMPRESSION:  1. Right-sided uncovertebral and facet hypertrophy at C3-4 with  resultant moderate right C4 foraminal stenosis.  2. Degenerative disc bulge with uncovertebral spurring at C4-5 with  resultant moderate bilateral C5 foraminal stenosis.  3. Relatively minor spondylosis elsewhere within the cervical spine  as above. No other significant canal or foraminal stenosis.    Electronically Signed    By: Rise Mu M.D.    On: 02/05/2022 04:49     Objective:  VS:  HT:    WT:   BMI:     BP:137/87  HR:(!) 59bpm  TEMP: ( )  RESP:  Physical Exam Vitals and nursing note reviewed.  Constitutional:      General: He is not in acute distress.    Appearance: Normal appearance. He is not ill-appearing.  HENT:     Head: Normocephalic and atraumatic.     Right Ear: External ear normal.     Left Ear: External ear  normal.  Eyes:     Extraocular Movements: Extraocular movements intact.  Cardiovascular:     Rate and Rhythm: Normal rate.     Pulses: Normal pulses.  Abdominal:     General: There is no distension.     Palpations: Abdomen is soft.  Musculoskeletal:        General: No signs of injury.     Cervical back: Neck supple. Tenderness present. No rigidity.     Right lower leg: No edema.     Left lower leg: No edema.     Comments: Patient has good strength in the upper extremities with 5 out of 5 strength in wrist extension long finger flexion APB.  No intrinsic hand muscle atrophy.  Negative Hoffmann's test.  Lymphadenopathy:     Cervical: No cervical adenopathy.  Skin:    Findings: No erythema or rash.  Neurological:     General: No focal deficit present.     Mental Status: He is alert and oriented to person, place, and time.     Sensory: No sensory deficit.     Motor: No weakness or abnormal muscle tone.     Coordination: Coordination normal.  Psychiatric:        Mood and Affect: Mood normal.        Behavior: Behavior normal.      Imaging: XR C-ARM NO REPORT  Result Date: 02/04/2023 Please see Notes  tab for imaging impression.

## 2023-02-05 NOTE — Procedures (Signed)
Cervical Epidural Steroid Injection - Interlaminar Approach with Fluoroscopic Guidance  Patient: Mathew Diaz      Date of Birth: 27-Sep-1960 MRN: [id number] PCP: Wendall Papa      Visit Date: 02/04/2023   Universal Protocol:    Date/Time: 09/24/248:49 AM  Consent Given By: the patient  Position: PRONE  Additional Comments: Vital signs were monitored before and after the procedure. Patient was prepped and draped in the usual sterile fashion. The correct patient, procedure, and site was verified.   Injection Procedure Details:   Procedure diagnoses: Cervical radiculopathy [M54.12]    Meds Administered:  Meds ordered this encounter  Medications   methylPREDNISolone acetate (DEPO-MEDROL) injection 80 mg     Laterality: Right  Location/Site: C7-T1  Needle: 3.5 in., 20 ga. Tuohy  Needle Placement: Paramedian epidural space  Findings:  -Comments: Excellent flow of contrast into the epidural space.  Procedure Details: Using a paramedian approach from the side mentioned above, the region overlying the inferior lamina was localized under fluoroscopic visualization and the soft tissues overlying this structure were infiltrated with 4 ml. of 1% Lidocaine without Epinephrine. A # 20 gauge, Tuohy needle was inserted into the epidural space using a paramedian approach.  The epidural space was localized using loss of resistance along with contralateral oblique bi-planar fluoroscopic views.  After negative aspirate for air, blood, and CSF, a 2 ml. volume of Isovue-250 was injected into the epidural space and the flow of contrast was observed. Radiographs were obtained for documentation purposes.   The injectate was administered into the level noted above.  Additional Comments:  No complications occurred Dressing: 2 x 2 sterile gauze and Band-Aid    Post-procedure details: Patient was observed during the procedure. Post-procedure instructions were reviewed.  Patient left  the clinic in stable condition.

## 2023-04-25 ENCOUNTER — Telehealth: Payer: Self-pay | Admitting: Physical Medicine and Rehabilitation

## 2023-04-25 DIAGNOSIS — M5412 Radiculopathy, cervical region: Secondary | ICD-10-CM

## 2023-04-25 NOTE — Telephone Encounter (Signed)
Patient called. he would like an appointment with Dr. Alvester Morin

## 2023-05-16 ENCOUNTER — Other Ambulatory Visit: Payer: Self-pay | Admitting: Physical Medicine and Rehabilitation

## 2023-05-16 ENCOUNTER — Encounter: Payer: Self-pay | Admitting: Physical Medicine and Rehabilitation

## 2023-05-16 MED ORDER — DIAZEPAM 5 MG PO TABS: ORAL_TABLET | ORAL | 0 refills | Status: DC

## 2023-05-29 ENCOUNTER — Encounter: Payer: Self-pay | Admitting: Physical Medicine and Rehabilitation

## 2023-05-29 ENCOUNTER — Other Ambulatory Visit: Payer: Self-pay

## 2023-05-29 ENCOUNTER — Ambulatory Visit: Payer: 59 | Admitting: Physical Medicine and Rehabilitation

## 2023-05-29 DIAGNOSIS — M5412 Radiculopathy, cervical region: Secondary | ICD-10-CM

## 2023-05-29 DIAGNOSIS — G894 Chronic pain syndrome: Secondary | ICD-10-CM | POA: Diagnosis not present

## 2023-05-29 DIAGNOSIS — M4802 Spinal stenosis, cervical region: Secondary | ICD-10-CM | POA: Diagnosis not present

## 2023-05-29 DIAGNOSIS — M792 Neuralgia and neuritis, unspecified: Secondary | ICD-10-CM

## 2023-05-29 MED ORDER — GABAPENTIN 300 MG PO CAPS: 300.0000 mg | ORAL_CAPSULE | Freq: Every day | ORAL | 0 refills | Status: DC

## 2023-05-29 MED ORDER — METHYLPREDNISOLONE ACETATE 40 MG/ML IJ SUSP
40.0000 mg | Freq: Once | INTRAMUSCULAR | Status: AC
  Administered 2023-05-29: 40 mg

## 2023-05-29 NOTE — Progress Notes (Signed)
Mathew Diaz - 63 y.o. male MRN [id number]  Date of birth: 03/20/1961  Office Visit Note: Visit Date: 05/29/2023 PCP: Orvilla Cornwall C Referred by: Wendall Papa, NP  Subjective: Chief Complaint  Patient presents with   Neck - Pain   Right Shoulder - Pain   Right Arm - Pain, Numbness   HPI: Mathew Diaz is a 63 y.o. male who comes in today for evaluation and management at the request of Ellin Goodie, FNP and Dr. Annell Greening for at least a year of chronic intermittent severe and now worsening neck right shoulder and arm pain.  He reports 2 prior epidural injections that we have performed have been helpful but he states today that really they have not been that helpful and he is wondering about medications and surgery etc.  He does not really want to have surgery.  Reviewing her notes show that on 2 phone calls where he went to repeat the injection he reported 75% relief for 3 months which is actually a fairly good outcome.  Reviewing the MRI with him today he does have uncovertebral joint hypertrophy on the right with foraminal lying C3-4 and biforaminal narrowing at C5-6 but he does not have any high-grade central stenosis or frank severe nerve compression.  Obviously those image findings do not necessarily preclude severe pain from nerve root irritation.  He has not noted any focal weakness.  I talked him at length today greater than 25 minutes concerning his condition.  Upon further review he states that the pain that was going into his hand and forearm has really resolved at this point.  He has had no history of carpal tunnel problems or nerve conduction studies.  Most of the pain now is in the neck and shoulder.  He has had treatment by Dr. Ophelia Charter for his shoulder in the past.  He has not had any medications that been very helpful.  He has tried tramadol that is not strong enough he says.  He has not tried any medications such as Lyrica or gabapentin or duloxetine.  He has not noted  any new trauma or other issues.      Review of Systems  Musculoskeletal:  Positive for joint pain and neck pain.  Neurological:  Positive for tingling.  All other systems reviewed and are negative.  Otherwise per HPI.  Assessment & Plan: Visit Diagnoses:    ICD-10-CM   1. Cervical radiculopathy  M54.12 XR C-ARM NO REPORT    Epidural Steroid injection    methylPREDNISolone acetate (DEPO-MEDROL) injection 40 mg    2. 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 Neuropathic pain  M79.2     4. Chronic pain syndrome  G89.4        Plan: Findings:  1.  Chronic worsening neck pain with referral into the arm and shoulder now with significant relief of distal symptoms after 2 epidural injections but continued intermittent increasing neck and shoulder pain.  It seems to be the amount of relief he gets with the epidural that this is still a cervical source of pain.  No real frank nerve compression on MRI but significant foraminal narrowing at 2 places.  I think the best approach is to repeat the injection once more as it can vary on how much it helps.  If he is not satisfied with the amount of relief or length of time and I think referral to Dr. Ophelia Charter or Dr. Christell Constant to look at  his cervical spine from a surgical standpoint would be warranted.  In terms of pain management overall we can talk about regrouping with physical therapy, TENS unit and medications.  We talked at great length today about medications.  I am going to start gabapentin at night to try to help him sleep.  If this helps him then we may be able to increase this slowly during the day.  If is not very helpful and the injections are not real full he may be a good candidate for comprehensive chronic pain management and we can make that referral.    Meds & Orders:  Meds ordered this encounter  Medications   methylPREDNISolone acetate (DEPO-MEDROL) injection 40 mg   gabapentin (NEURONTIN) 300 MG capsule    Sig: Take 1  capsule (300 mg total) by mouth at bedtime. Take 1 tab daily at bedtime for 7days, then 1 tab in the morning and 1 tab at night for 7 days, then 1 tab 3x a day    Dispense:  90 capsule    Refill:  0    Orders Placed This Encounter  Procedures   XR C-ARM NO REPORT   Epidural Steroid injection    Follow-up: Return if symptoms worsen or fail to improve.   Procedures: No procedures performed  Cervical Epidural Steroid Injection - Interlaminar Approach with Fluoroscopic Guidance  Patient: Mathew Diaz      Date of Birth: July 17, 1960 MRN: [id number] PCP: Wendall Papa      Visit Date: 05/29/2023   Universal Protocol:    Date/Time: 01/22/257:58 PM  Consent Given By: the patient  Position: PRONE  Additional Comments: Vital signs were monitored before and after the procedure. Patient was prepped and draped in the usual sterile fashion. The correct patient, procedure, and site was verified.   Injection Procedure Details:   Procedure diagnoses: Cervical radiculopathy [M54.12]    Meds Administered:  Meds ordered this encounter  Medications   methylPREDNISolone acetate (DEPO-MEDROL) injection 40 mg   gabapentin (NEURONTIN) 300 MG capsule    Sig: Take 1 capsule (300 mg total) by mouth at bedtime. Take 1 tab daily at bedtime for 7days, then 1 tab in the morning and 1 tab at night for 7 days, then 1 tab 3x a day    Dispense:  90 capsule    Refill:  0     Laterality: Right  Location/Site: C7-T1  Needle: 3.5 in., 20 ga. Tuohy  Needle Placement: Paramedian epidural space  Findings:  -Comments: Excellent flow of contrast into the epidural space.  Procedure Details: Using a paramedian approach from the side mentioned above, the region overlying the inferior lamina was localized under fluoroscopic visualization and the soft tissues overlying this structure were infiltrated with 4 ml. of 1% Lidocaine without Epinephrine. A # 20 gauge, Tuohy needle was inserted into the  epidural space using a paramedian approach.  The epidural space was localized using loss of resistance along with contralateral oblique bi-planar fluoroscopic views.  After negative aspirate for air, blood, and CSF, a 2 ml. volume of Isovue-250 was injected into the epidural space and the flow of contrast was observed. Radiographs were obtained for documentation purposes.   The injectate was administered into the level noted above.  Additional Comments:  No complications occurred Dressing: 2 x 2 sterile gauze and Band-Aid    Post-procedure details: Patient was observed during the procedure. Post-procedure instructions were reviewed.  Patient left the clinic in stable condition.   Clinical History: EXAM:  MRI CERVICAL SPINE WITHOUT CONTRAST   TECHNIQUE:  Multiplanar, multisequence MR imaging of the cervical spine was  performed. No intravenous contrast was administered.   COMPARISON:  Prior radiograph from 03/28/2021.   FINDINGS:  Alignment: Straightening of the normal cervical lordosis. No  listhesis.   Vertebrae: Vertebral body height maintained without acute or chronic  fracture. Bone marrow signal intensity mildly heterogeneous but  overall within normal limits. Small benign hemangioma noted within  the C7 vertebral body. No worrisome osseous lesions. No abnormal  marrow edema.   Cord: Normal signal and morphology.   Posterior Fossa, vertebral arteries, paraspinal tissues:  Unremarkable.   Disc levels:   C2-C3: Unremarkable.   C3-C4: Mild annular disc bulge with right-sided uncovertebral  spurring. Right greater than left facet hypertrophy. No spinal  stenosis. Moderate right C4 foraminal narrowing. Left neural foramen  remains patent.   C4-C5: Mild intervertebral disc space narrowing with diffuse disc  osteophyte complex, eccentric to the right. Flattening and partial  effacement of the ventral thecal sac without significant spinal  stenosis. Superimposed  mild facet hypertrophy. Moderate bilateral C5  foraminal stenosis.   C5-C6: Minimal annular disc bulge. Mild left-sided facet  hypertrophy. No spinal stenosis. Foramina remain patent.   C6-C7: Minimal annular disc bulge. No spinal stenosis. Foramina  remain patent.   C7-T1: Negative interspace. Moderate right facet hypertrophy. No  spinal stenosis. Foramina remain patent.   IMPRESSION:  1. Right-sided uncovertebral and facet hypertrophy at C3-4 with  resultant moderate right C4 foraminal stenosis.  2. Degenerative disc bulge with uncovertebral spurring at C4-5 with  resultant moderate bilateral C5 foraminal stenosis.  3. Relatively minor spondylosis elsewhere within the cervical spine  as above. No other significant canal or foraminal stenosis.    Electronically Signed    By: Rise Mu M.D.    On: 02/05/2022 04:49   He reports that he has quit smoking. He has quit using smokeless tobacco. No results for input(s): "HGBA1C", "LABURIC" in the last 8760 hours.  Objective:  VS:  HT:    WT:   BMI:     BP:   HR: bpm  TEMP: ( )  RESP:  Physical Exam Vitals and nursing note reviewed.  Constitutional:      General: He is not in acute distress.    Appearance: Normal appearance. He is well-developed. He is not ill-appearing.  HENT:     Head: Normocephalic and atraumatic.     Right Ear: External ear normal.     Left Ear: External ear normal.  Eyes:     Extraocular Movements: Extraocular movements intact.     Conjunctiva/sclera: Conjunctivae normal.     Pupils: Pupils are equal, round, and reactive to light.  Cardiovascular:     Rate and Rhythm: Normal rate.     Pulses: Normal pulses.     Heart sounds: Normal heart sounds.  Pulmonary:     Effort: Pulmonary effort is normal. No respiratory distress.  Abdominal:     General: There is no distension.     Palpations: Abdomen is soft.  Musculoskeletal:        General: Tenderness present. No signs of injury.      Cervical back: Neck supple. No rigidity.     Right lower leg: No edema.     Left lower leg: No edema.     Comments: Patient has good strength in the upper extremities with 5 out of 5 strength in wrist extension long finger flexion APB.  No intrinsic  hand muscle atrophy.  Negative Hoffmann's test.  Equivocal Spurling's to the right.  Does have some shoulder impingement issues on the right.  Lymphadenopathy:     Cervical: No cervical adenopathy.  Skin:    General: Skin is warm and dry.     Findings: No erythema or rash.  Neurological:     General: No focal deficit present.     Mental Status: He is alert and oriented to person, place, and time.     Cranial Nerves: No cranial nerve deficit.     Sensory: No sensory deficit.     Motor: No weakness or abnormal muscle tone.     Coordination: Coordination normal.     Gait: Gait normal.  Psychiatric:        Mood and Affect: Mood normal.        Behavior: Behavior normal.     Ortho Exam  Imaging: No results found.  Past Medical/Family/Surgical/Social History: Medications & Allergies reviewed per EMR, new medications updated. Patient Active Problem List   Diagnosis Date Noted   Other spondylosis with radiculopathy, cervical region 01/26/2022   Past Medical History:  Diagnosis Date   Acid reflux    Arthritis    Hypertension    Family History  Problem Relation Age of Onset   Cancer Father    Past Surgical History:  Procedure Laterality Date   Brachaeplexis  1985   EXCISIONAL HEMORRHOIDECTOMY     Tendon Transfers     Social History   Occupational History   Not on file  Tobacco Use   Smoking status: Former   Smokeless tobacco: Former  Substance and Sexual Activity   Alcohol use: No   Drug use: No   Sexual activity: Not on file

## 2023-05-29 NOTE — Patient Instructions (Signed)

## 2023-06-05 NOTE — Procedures (Signed)
Cervical Epidural Steroid Injection - Interlaminar Approach with Fluoroscopic Guidance  Patient: Mathew Diaz      Date of Birth: 29-Sep-1960 MRN: [id number] PCP: Wendall Papa      Visit Date: 05/29/2023   Universal Protocol:    Date/Time: 01/22/257:58 PM  Consent Given By: the patient  Position: PRONE  Additional Comments: Vital signs were monitored before and after the procedure. Patient was prepped and draped in the usual sterile fashion. The correct patient, procedure, and site was verified.   Injection Procedure Details:   Procedure diagnoses: Cervical radiculopathy [M54.12]    Meds Administered:  Meds ordered this encounter  Medications   methylPREDNISolone acetate (DEPO-MEDROL) injection 40 mg   gabapentin (NEURONTIN) 300 MG capsule    Sig: Take 1 capsule (300 mg total) by mouth at bedtime. Take 1 tab daily at bedtime for 7days, then 1 tab in the morning and 1 tab at night for 7 days, then 1 tab 3x a day    Dispense:  90 capsule    Refill:  0     Laterality: Right  Location/Site: C7-T1  Needle: 3.5 in., 20 ga. Tuohy  Needle Placement: Paramedian epidural space  Findings:  -Comments: Excellent flow of contrast into the epidural space.  Procedure Details: Using a paramedian approach from the side mentioned above, the region overlying the inferior lamina was localized under fluoroscopic visualization and the soft tissues overlying this structure were infiltrated with 4 ml. of 1% Lidocaine without Epinephrine. A # 20 gauge, Tuohy needle was inserted into the epidural space using a paramedian approach.  The epidural space was localized using loss of resistance along with contralateral oblique bi-planar fluoroscopic views.  After negative aspirate for air, blood, and CSF, a 2 ml. volume of Isovue-250 was injected into the epidural space and the flow of contrast was observed. Radiographs were obtained for documentation purposes.   The injectate was administered  into the level noted above.  Additional Comments:  No complications occurred Dressing: 2 x 2 sterile gauze and Band-Aid    Post-procedure details: Patient was observed during the procedure. Post-procedure instructions were reviewed.  Patient left the clinic in stable condition.

## 2024-02-14 ENCOUNTER — Telehealth: Payer: Self-pay

## 2024-02-14 ENCOUNTER — Other Ambulatory Visit: Payer: Self-pay | Admitting: Physical Medicine and Rehabilitation

## 2024-02-14 DIAGNOSIS — M5412 Radiculopathy, cervical region: Secondary | ICD-10-CM

## 2024-02-14 NOTE — Telephone Encounter (Signed)
 Last injection-01/25 % 75 Lasted --5 Pain score--7 No falls or injuries Same pain and same area as last time

## 2024-02-17 ENCOUNTER — Telehealth: Payer: Self-pay

## 2024-02-17 ENCOUNTER — Other Ambulatory Visit: Payer: Self-pay | Admitting: Physical Medicine and Rehabilitation

## 2024-02-17 MED ORDER — DIAZEPAM 5 MG PO TABS: ORAL_TABLET | ORAL | 0 refills | Status: AC

## 2024-02-17 NOTE — Telephone Encounter (Signed)
 Patient requesting pre procedural Valium 

## 2024-03-09 ENCOUNTER — Ambulatory Visit: Admitting: Physical Medicine and Rehabilitation

## 2024-03-09 ENCOUNTER — Other Ambulatory Visit: Payer: Self-pay

## 2024-03-09 VITALS — BP 160/97 | HR 66

## 2024-03-09 DIAGNOSIS — M5412 Radiculopathy, cervical region: Secondary | ICD-10-CM

## 2024-03-09 MED ORDER — METHYLPREDNISOLONE ACETATE 40 MG/ML IJ SUSP
40.0000 mg | Freq: Once | INTRAMUSCULAR | Status: AC
  Administered 2024-03-09: 40 mg

## 2024-03-09 NOTE — Procedures (Signed)
 Cervical Epidural Steroid Injection - Interlaminar Approach with Fluoroscopic Guidance  Patient: Mathew Diaz      Date of Birth: 1961-04-29 MRN: [id number] PCP: Hairfield, Keavie C      Visit Date: 03/09/2024   Universal Protocol:    Date/Time: 10/27/259:08 PM  Consent Given By: the patient  Position: PRONE  Additional Comments: Vital signs were monitored before and after the procedure. Patient was prepped and draped in the usual sterile fashion. The correct patient, procedure, and site was verified.   Injection Procedure Details:   Procedure diagnoses: Cervical radiculopathy [M54.12]    Meds Administered:  Meds ordered this encounter  Medications   methylPREDNISolone  acetate (DEPO-MEDROL ) injection 40 mg     Laterality: Right  Location/Site: C7-T1  Needle: 3.5 in., 20 ga. Tuohy  Needle Placement: Paramedian epidural space  Findings:  -Comments: Excellent flow of contrast into the epidural space.  Procedure Details: Using a paramedian approach from the side mentioned above, the region overlying the inferior lamina was localized under fluoroscopic visualization and the soft tissues overlying this structure were infiltrated with 4 ml. of 1% Lidocaine  without Epinephrine. A # 20 gauge, Tuohy needle was inserted into the epidural space using a paramedian approach.  The epidural space was localized using loss of resistance along with contralateral oblique bi-planar fluoroscopic views.  After negative aspirate for air, blood, and CSF, a 2 ml. volume of Isovue-250 was injected into the epidural space and the flow of contrast was observed. Radiographs were obtained for documentation purposes.   The injectate was administered into the level noted above.  Additional Comments:  The patient tolerated the procedure well Dressing: 2 x 2 sterile gauze and Band-Aid    Post-procedure details: Patient was observed during the procedure. Post-procedure instructions were  reviewed.  Patient left the clinic in stable condition.

## 2024-03-09 NOTE — Progress Notes (Signed)
 Pain Scale   Average Pain 6 Patient advising he has chronic neck pain radiating to right shoulder and radiating to right arm causing numbness. Patient advising he's pain is constant.        +Driver, -BT, -Dye Allergies.

## 2024-03-09 NOTE — Progress Notes (Signed)
 Mathew Diaz - 63 y.o. male MRN [id number]  Date of birth: [birth year]  Office Visit Note: Visit Date: 03/09/2024 PCP: Hairfield, Keavie C Referred by: Hairfield, Keavie C, NP  Subjective: Chief Complaint  Patient presents with   Lower Back - Pain   HPI:  Mathew Diaz is a 63 y.o. male who comes in today for planned repeat Right C7-T1  Cervical Interlaminar epidural steroid injection with fluoroscopic guidance.  The patient has failed conservative care including home exercise, medications, time and activity modification.  This injection will be diagnostic and hopefully therapeutic.  Please see requesting physician notes for further details and justification. Patient received more than 50% pain relief from prior injection.   Referring: Duwaine Pouch, FNP   ROS Otherwise per HPI.  Assessment & Plan: Visit Diagnoses:    ICD-10-CM   1. Cervical radiculopathy  M54.12 XR C-ARM NO REPORT    Epidural Steroid injection    methylPREDNISolone  acetate (DEPO-MEDROL ) injection 40 mg      Plan: No additional findings.   Meds & Orders:  Meds ordered this encounter  Medications   methylPREDNISolone  acetate (DEPO-MEDROL ) injection 40 mg    Orders Placed This Encounter  Procedures   XR C-ARM NO REPORT   Epidural Steroid injection    Follow-up: Return for visit to requesting provider as needed.   Procedures: No procedures performed  Cervical Epidural Steroid Injection - Interlaminar Approach with Fluoroscopic Guidance  Patient: Mathew Diaz      Date of Birth: 1960-06-26 MRN: [id number] PCP: Hairfield, Keavie C      Visit Date: 03/09/2024   Universal Protocol:    Date/Time: 10/27/259:08 PM  Consent Given By: the patient  Position: PRONE  Additional Comments: Vital signs were monitored before and after the procedure. Patient was prepped and draped in the usual sterile fashion. The correct patient, procedure, and site was verified.   Injection Procedure Details:    Procedure diagnoses: Cervical radiculopathy [M54.12]    Meds Administered:  Meds ordered this encounter  Medications   methylPREDNISolone  acetate (DEPO-MEDROL ) injection 40 mg     Laterality: Right  Location/Site: C7-T1  Needle: 3.5 in., 20 ga. Tuohy  Needle Placement: Paramedian epidural space  Findings:  -Comments: Excellent flow of contrast into the epidural space.  Procedure Details: Using a paramedian approach from the side mentioned above, the region overlying the inferior lamina was localized under fluoroscopic visualization and the soft tissues overlying this structure were infiltrated with 4 ml. of 1% Lidocaine  without Epinephrine. A # 20 gauge, Tuohy needle was inserted into the epidural space using a paramedian approach.  The epidural space was localized using loss of resistance along with contralateral oblique bi-planar fluoroscopic views.  After negative aspirate for air, blood, and CSF, a 2 ml. volume of Isovue-250 was injected into the epidural space and the flow of contrast was observed. Radiographs were obtained for documentation purposes.   The injectate was administered into the level noted above.  Additional Comments:  The patient tolerated the procedure well Dressing: 2 x 2 sterile gauze and Band-Aid    Post-procedure details: Patient was observed during the procedure. Post-procedure instructions were reviewed.  Patient left the clinic in stable condition.    Clinical History: EXAM:  MRI CERVICAL SPINE WITHOUT CONTRAST   TECHNIQUE:  Multiplanar, multisequence MR imaging of the cervical spine was  performed. No intravenous contrast was administered.   COMPARISON:  Prior radiograph from 03/28/2021.   FINDINGS:  Alignment: Straightening of the normal cervical lordosis. No  listhesis.   Vertebrae: Vertebral body height maintained without acute or chronic  fracture. Bone marrow signal intensity mildly heterogeneous but  overall within normal  limits. Small benign hemangioma noted within  the C7 vertebral body. No worrisome osseous lesions. No abnormal  marrow edema.   Cord: Normal signal and morphology.   Posterior Fossa, vertebral arteries, paraspinal tissues:  Unremarkable.   Disc levels:   C2-C3: Unremarkable.   C3-C4: Mild annular disc bulge with right-sided uncovertebral  spurring. Right greater than left facet hypertrophy. No spinal  stenosis. Moderate right C4 foraminal narrowing. Left neural foramen  remains patent.   C4-C5: Mild intervertebral disc space narrowing with diffuse disc  osteophyte complex, eccentric to the right. Flattening and partial  effacement of the ventral thecal sac without significant spinal  stenosis. Superimposed mild facet hypertrophy. Moderate bilateral C5  foraminal stenosis.   C5-C6: Minimal annular disc bulge. Mild left-sided facet  hypertrophy. No spinal stenosis. Foramina remain patent.   C6-C7: Minimal annular disc bulge. No spinal stenosis. Foramina  remain patent.   C7-T1: Negative interspace. Moderate right facet hypertrophy. No  spinal stenosis. Foramina remain patent.   IMPRESSION:  1. Right-sided uncovertebral and facet hypertrophy at C3-4 with  resultant moderate right C4 foraminal stenosis.  2. Degenerative disc bulge with uncovertebral spurring at C4-5 with  resultant moderate bilateral C5 foraminal stenosis.  3. Relatively minor spondylosis elsewhere within the cervical spine  as above. No other significant canal or foraminal stenosis.    Electronically Signed    By: Morene Hoard M.D.    On: 02/05/2022 04:49     Objective:  VS:  HT:    WT:   BMI:     BP:(!) 160/97  HR:66bpm  TEMP: ( )  RESP:  Physical Exam Vitals and nursing note reviewed.  Constitutional:      General: He is not in acute distress.    Appearance: Normal appearance. He is not ill-appearing.  HENT:     Head: Normocephalic and atraumatic.     Right Ear: External ear  normal.     Left Ear: External ear normal.  Eyes:     Extraocular Movements: Extraocular movements intact.  Cardiovascular:     Rate and Rhythm: Normal rate.     Pulses: Normal pulses.  Abdominal:     General: There is no distension.     Palpations: Abdomen is soft.  Musculoskeletal:        General: No signs of injury.     Cervical back: Neck supple. Tenderness present. No rigidity.     Right lower leg: No edema.     Left lower leg: No edema.     Comments: Patient has good strength in the upper extremities with 5 out of 5 strength in wrist extension long finger flexion APB.  No intrinsic hand muscle atrophy.  Negative Hoffmann's test.  Lymphadenopathy:     Cervical: No cervical adenopathy.  Skin:    Findings: No erythema or rash.  Neurological:     General: No focal deficit present.     Mental Status: He is alert and oriented to person, place, and time.     Sensory: No sensory deficit.     Motor: No weakness or abnormal muscle tone.     Coordination: Coordination normal.  Psychiatric:        Mood and Affect: Mood normal.        Behavior: Behavior normal.      Imaging: XR C-ARM NO REPORT Result Date:  03/09/2024 Please see Notes tab for imaging impression.

## 2024-03-16 ENCOUNTER — Encounter: Payer: Self-pay | Admitting: Radiology
